# Patient Record
Sex: Female | Born: 1985 | Race: White | Hispanic: No | Marital: Single | Smoking: Current every day smoker
Health system: Southern US, Community
[De-identification: ages and names within clinical notes are randomized; demographics above are authoritative.]

## PROBLEM LIST (undated history)

## (undated) DIAGNOSIS — G43909 Migraine, unspecified, not intractable, without status migrainosus: Secondary | ICD-10-CM

## (undated) DIAGNOSIS — N200 Calculus of kidney: Secondary | ICD-10-CM

## (undated) DIAGNOSIS — F17200 Nicotine dependence, unspecified, uncomplicated: Secondary | ICD-10-CM

## (undated) HISTORY — DX: Nicotine dependence, unspecified, uncomplicated: F17.200

## (undated) HISTORY — PX: LITHOTRIPSY: SUR834

## (undated) HISTORY — PX: TONSILLECTOMY: SUR1361

---

## 2008-12-04 ENCOUNTER — Emergency Department: Payer: Self-pay | Admitting: Emergency Medicine

## 2008-12-15 ENCOUNTER — Ambulatory Visit: Payer: Self-pay | Admitting: Urology

## 2008-12-30 ENCOUNTER — Ambulatory Visit: Payer: Self-pay | Admitting: Urology

## 2009-01-02 ENCOUNTER — Ambulatory Visit: Payer: Self-pay | Admitting: Urology

## 2009-01-07 ENCOUNTER — Ambulatory Visit: Payer: Self-pay | Admitting: Urology

## 2009-01-09 ENCOUNTER — Emergency Department: Payer: Self-pay | Admitting: Emergency Medicine

## 2009-07-22 ENCOUNTER — Inpatient Hospital Stay: Payer: Self-pay | Admitting: Internal Medicine

## 2009-07-23 ENCOUNTER — Inpatient Hospital Stay: Payer: Self-pay | Admitting: Psychiatry

## 2009-12-31 IMAGING — CT CT STONE STUDY
1 of 2 series · 16 of 32 positions shown, 20 images · non-contrast
Comparison: none

REASON FOR EXAM: abdominal pain, recent lithotripsy
COMMENTS:   LMP: neg preg test

[Series 2: soft tissue · axial · 0.69mm/px · z∈[-88,+326]mm · 16 of 150 slices shown, 20 images]
[im 6/150  soft-tissue]
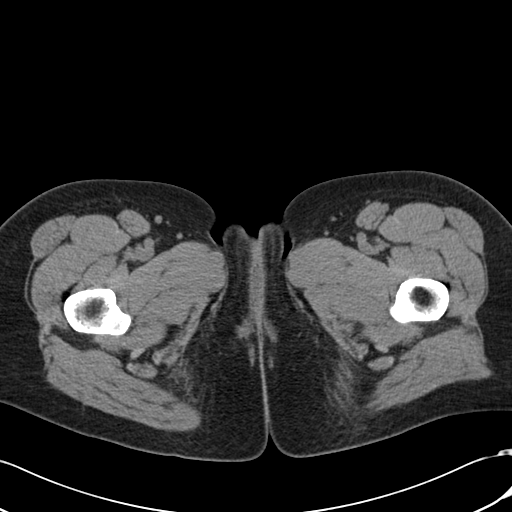
[im 6/150  bone]
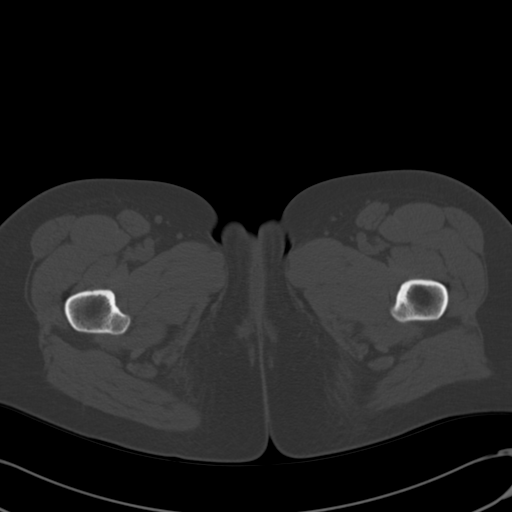
[im 18/150  soft-tissue]
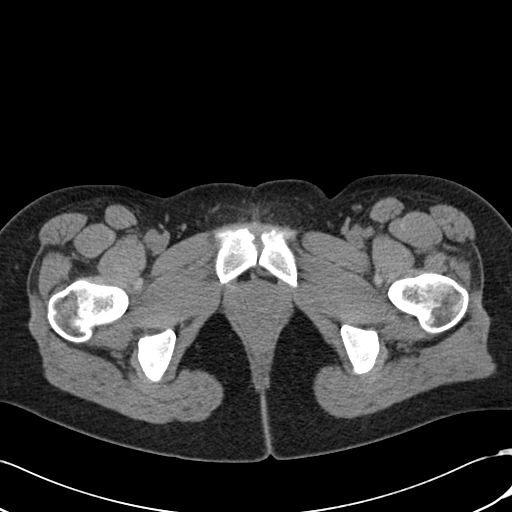
[im 30/150  soft-tissue]
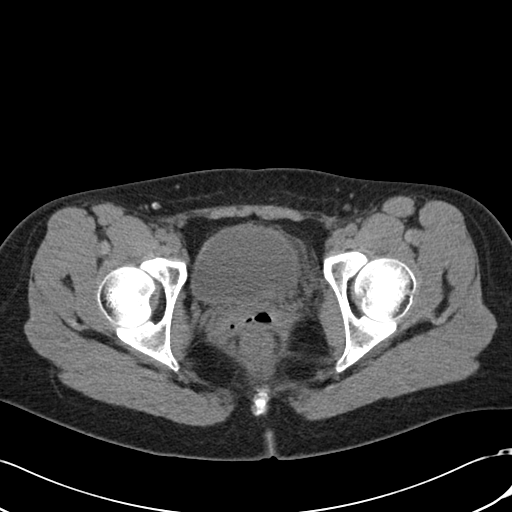
[im 42/150  soft-tissue]
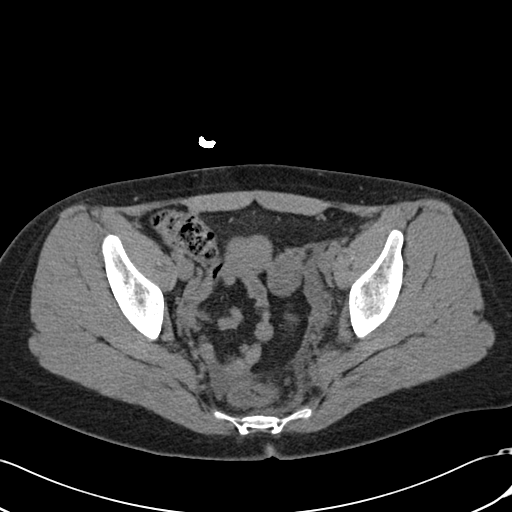
[im 48/150  soft-tissue]
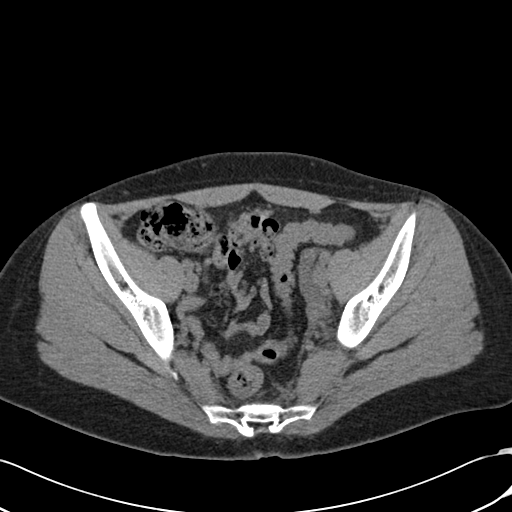
[im 60/150  soft-tissue]
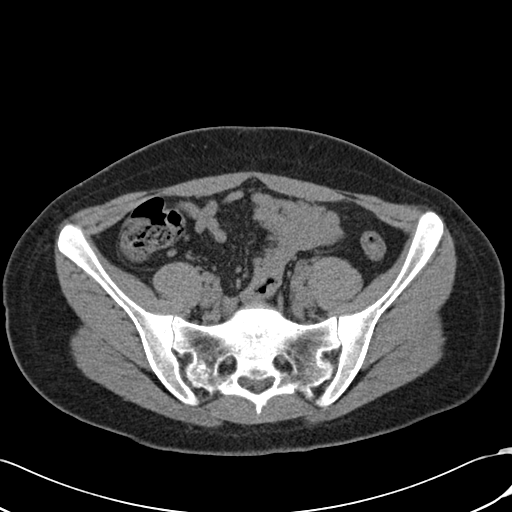
[im 72/150  soft-tissue]
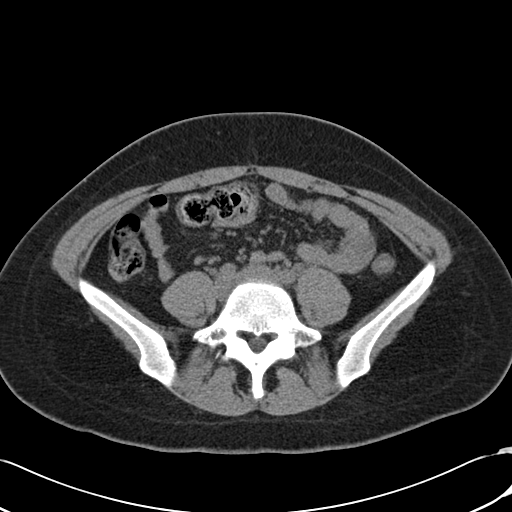
[im 78/150  soft-tissue]
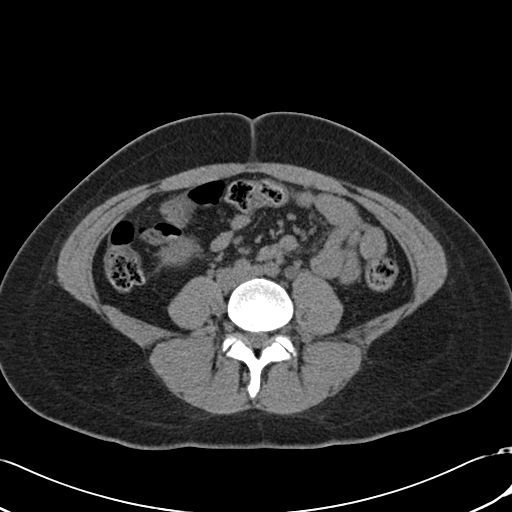
[im 90/150  soft-tissue]
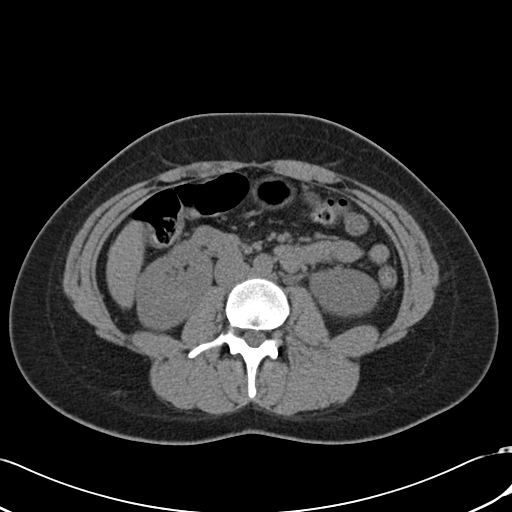
[im 90/150  bone]
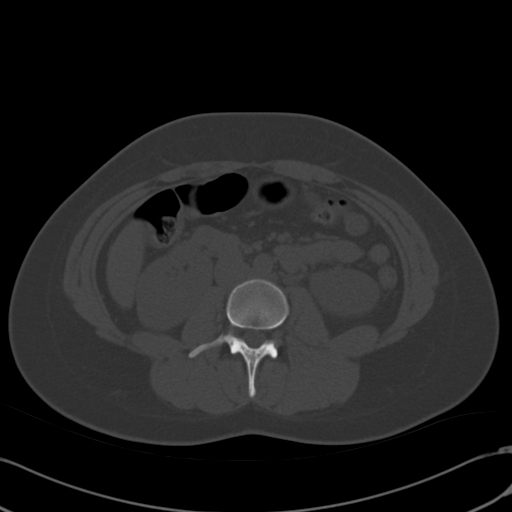
[im 102/150  soft-tissue]
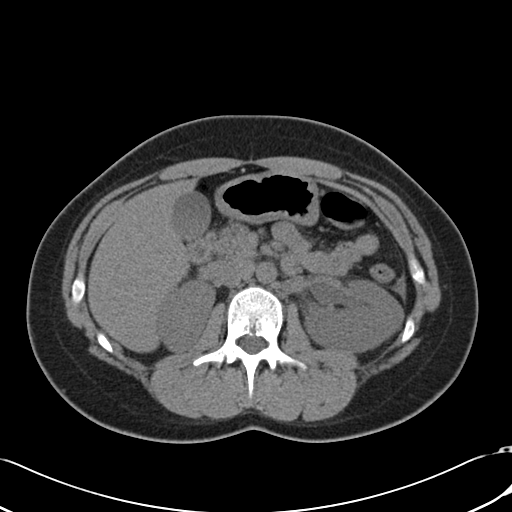
[im 114/150  soft-tissue]
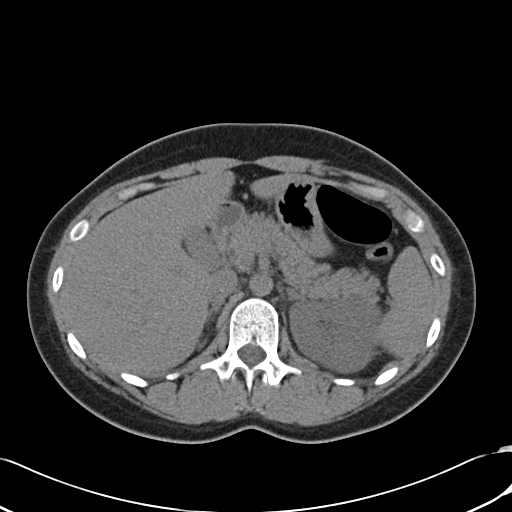
[im 120/150  soft-tissue]
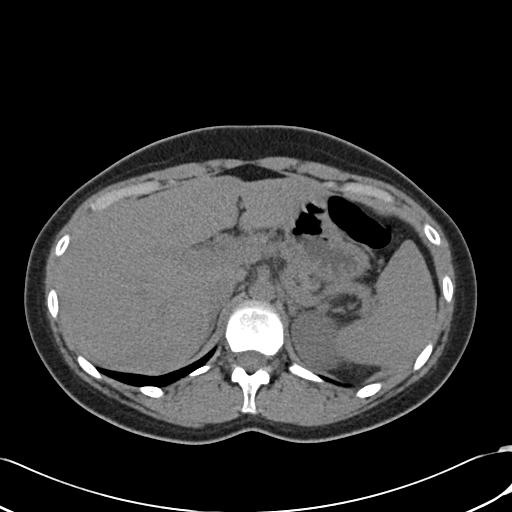
[im 126/150  lung]
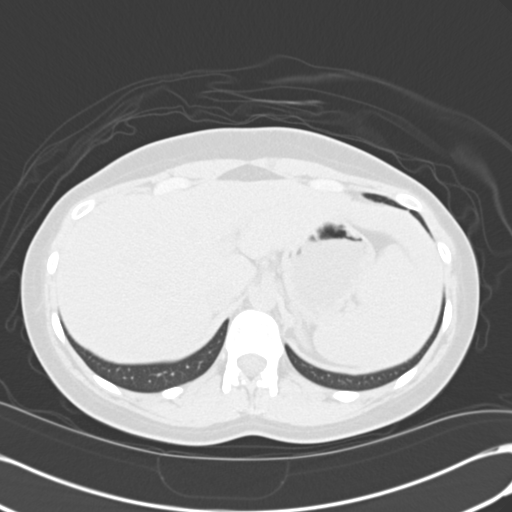
[im 132/150  soft-tissue]
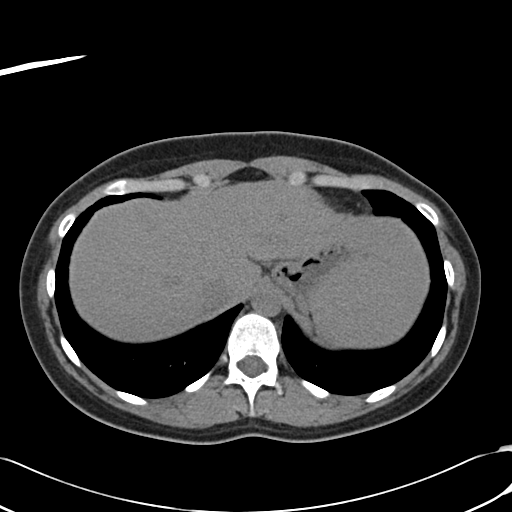
[im 132/150  lung]
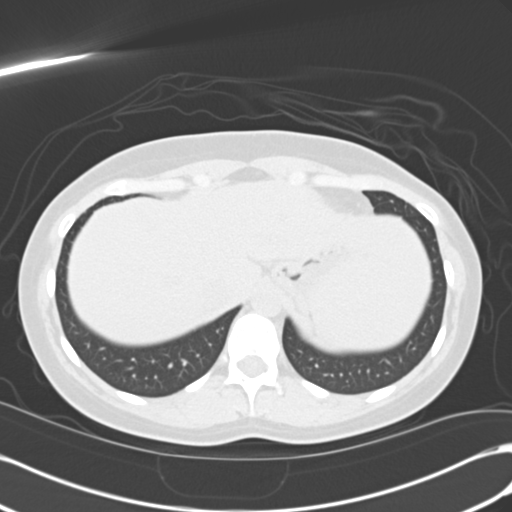
[im 138/150  lung]
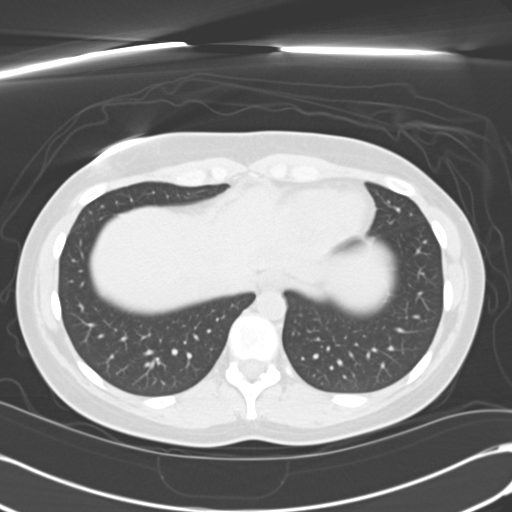
[im 144/150  soft-tissue]
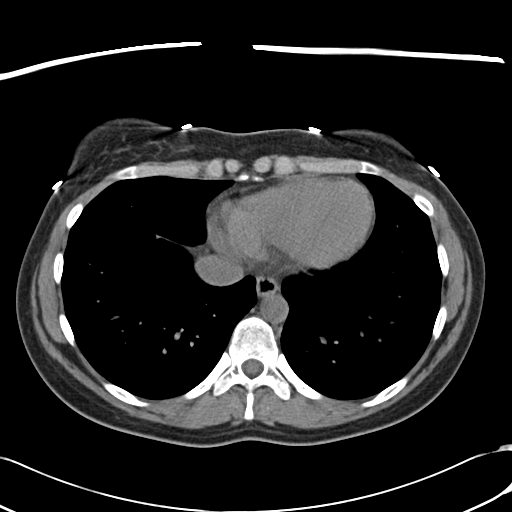
[im 144/150  lung]
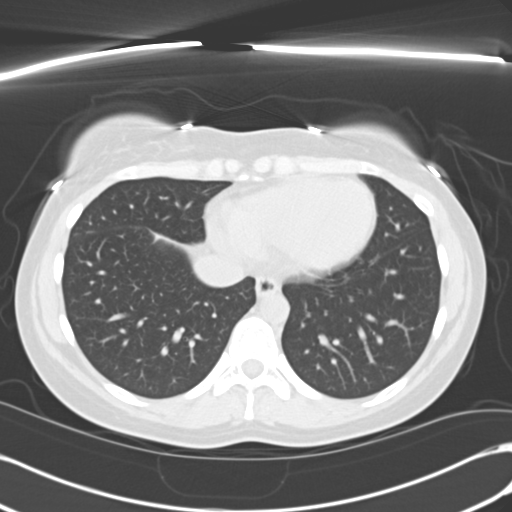

[16 of 32 positions shown; findings below may reference images not displayed]

PROCEDURE:     CT  - CT ABDOMEN /PELVIS WO (STONE)  - January 09, 2009  [DATE]

RESULT:     History: Pelvic pain

Comparison Study: Prior CT of the abdomen/ pelvis of 12/04/2008.

Procedure and findings: Standard nonenhanced CT the abdomen pelvis obtained.
Liver and spleen are normal. Pancreas normal. Right kidney is unremarkable.
Stone debris is noted in the distal left ureter. This is barely perceptible.
Present of a large distal left ureteral stone is no longer identified.
Bladder nondistended. Small amount of free fluid noted in the cul-de-sac.
Lung bases are clear.
IMPRESSION: Sand-like stone in the distal left ureter with mild left
hydronephrosis and hydroureter, previously identified large stone in the
distal left ureter is no longer present. Tiny small stone fragments may be.

## 2012-12-12 ENCOUNTER — Ambulatory Visit: Payer: Self-pay | Admitting: Family Medicine

## 2013-03-12 ENCOUNTER — Inpatient Hospital Stay: Payer: Self-pay

## 2013-03-12 LAB — CBC WITH DIFFERENTIAL/PLATELET
Basophil %: 0.2 %
HCT: 35.5 % (ref 35.0–47.0)
MCHC: 34.4 g/dL (ref 32.0–36.0)
MCV: 86 fL (ref 80–100)
Neutrophil %: 68.7 %
RBC: 4.15 10*6/uL (ref 3.80–5.20)
RDW: 13.2 % (ref 11.5–14.5)
WBC: 10.6 10*3/uL (ref 3.6–11.0)

## 2013-12-03 IMAGING — US US RENAL KIDNEY
1 series · 14 of 25 positions shown · non-contrast
Comparison: none

REASON FOR EXAM: Dr Dontsop Gustave Fax  [phone] Phone [phone]
[facility] [address]...
COMMENTS:

[Series 1: us renal kidney · 0.30mm/px · 14 of 44 slices shown]
[im 1/44]
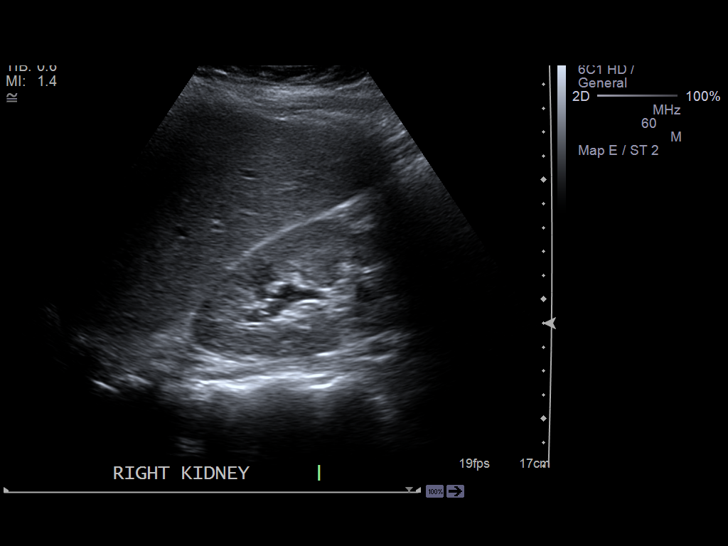
[im 4/44]
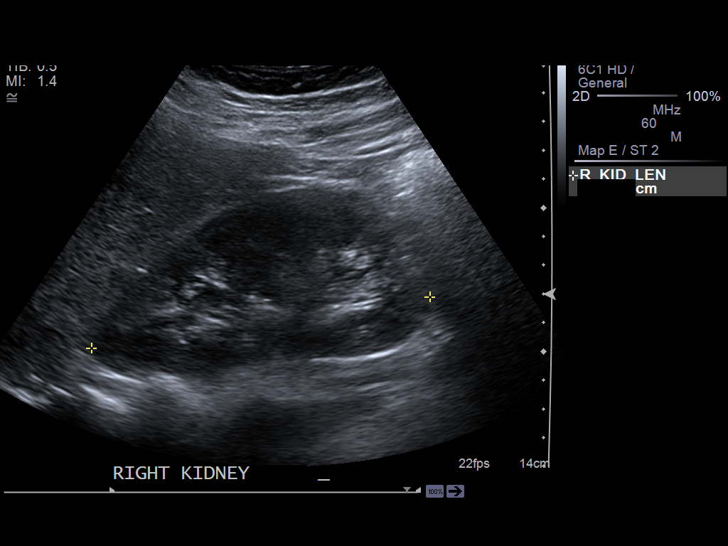
[im 8/44]
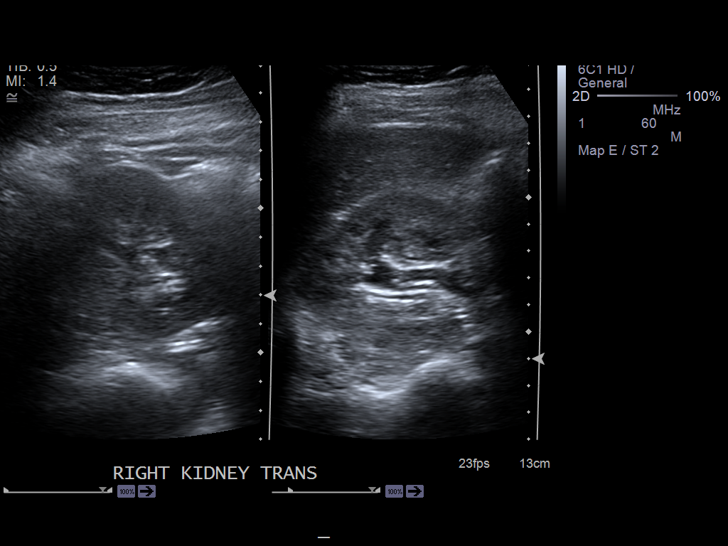
[im 11/44]
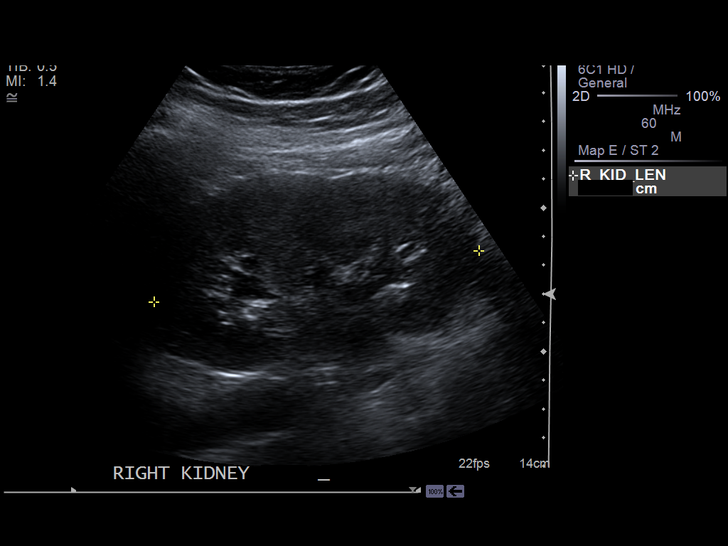
[im 15/44]
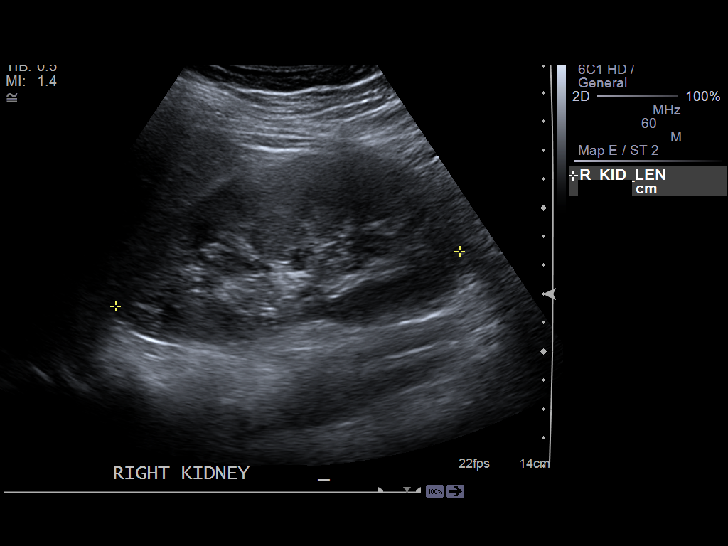
[im 17/44]
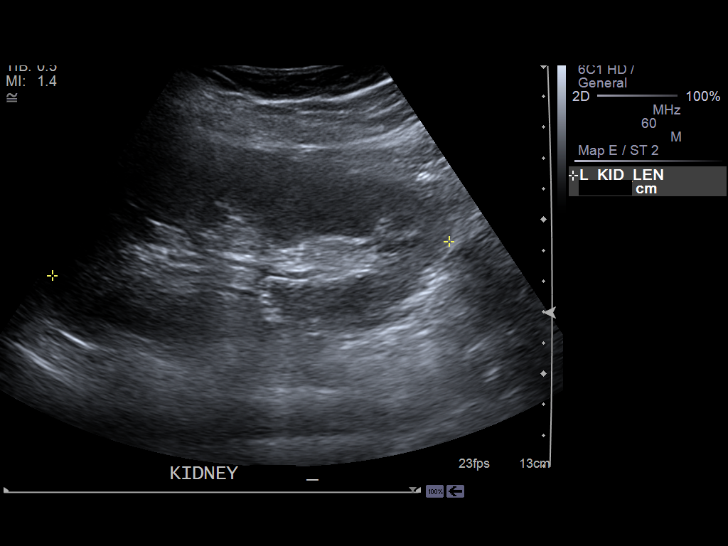
[im 20/44]
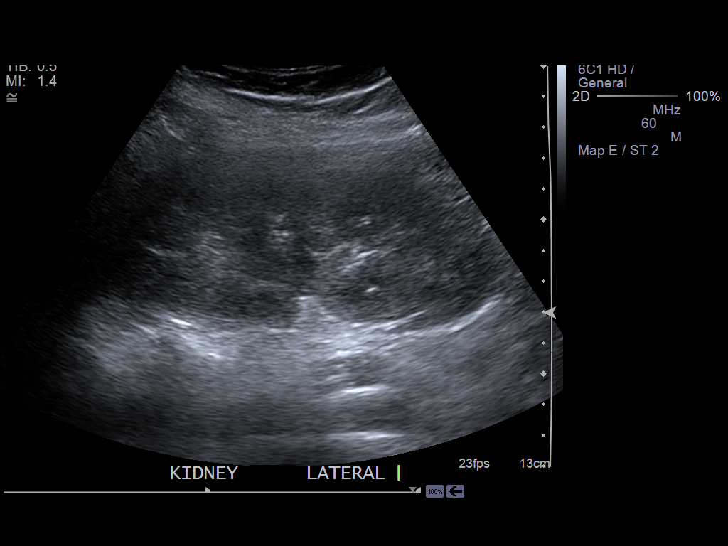
[im 24/44]
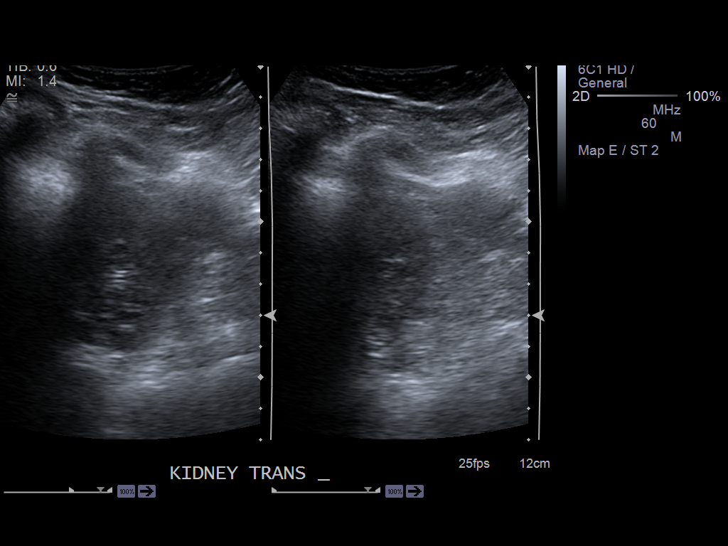
[im 27/44]
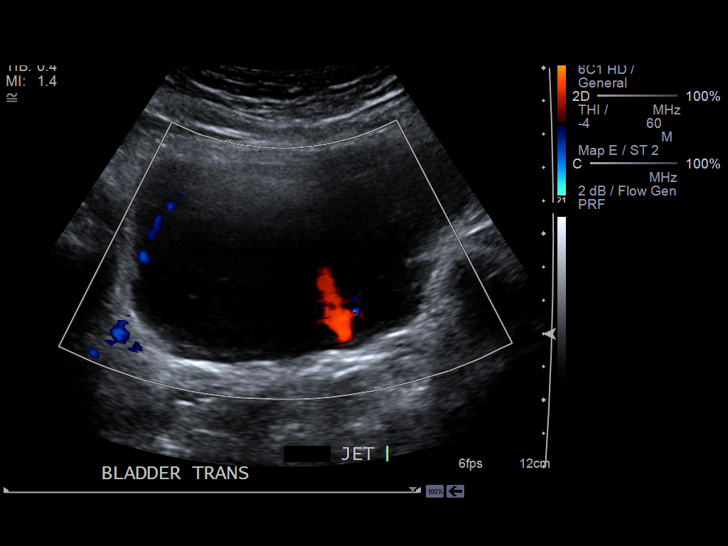
[im 29/44]
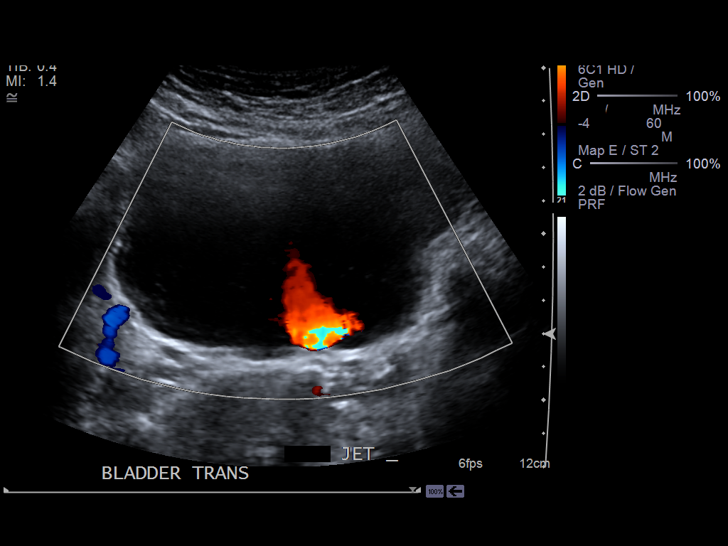
[im 33/44]
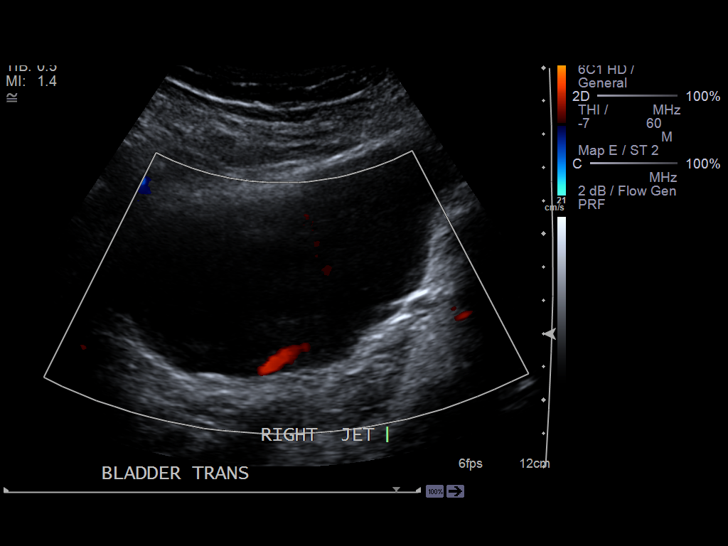
[im 36/44]
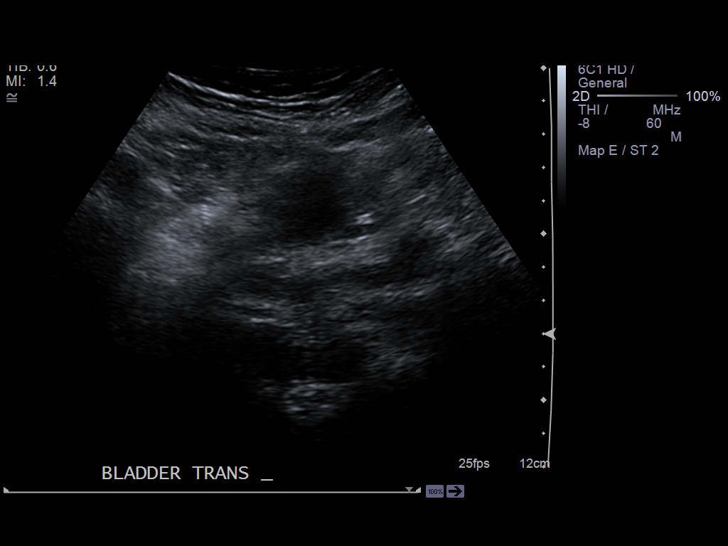
[im 40/44]
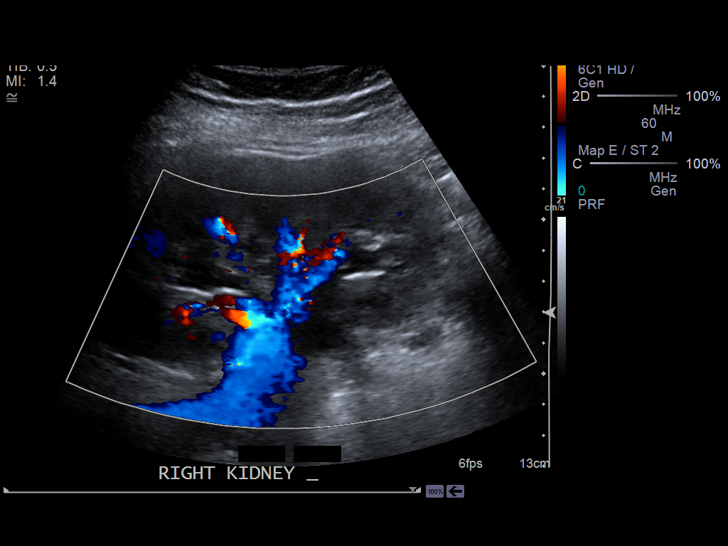
[im 44/44]
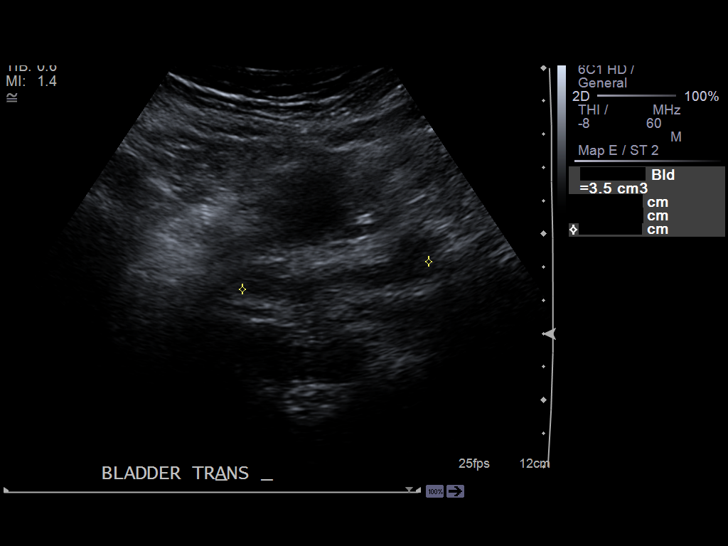

[14 of 25 positions shown; findings below may reference images not displayed]

PROCEDURE:     AMAZIGH - AMAZIGH KIDNEYS  - December 12, 2012  [DATE]

RESULT:     The right kidney measures 12.13 x 5.74 x 5.74 cm and shows
minimal right hydronephrosis. The left kidney measures 12.92 x 5.91 x
cm and shows no hydronephrosis. No stones or solid masses are seen. Ureteral
jets are demonstrated bilaterally at the bladder. There is a moderate amount
of urine in the bladder on the prevoid images. Bladder volume prior to
voiding is 124.2 cc. The post void bladder volume is 3.5 cc.
IMPRESSION: 1. Minimal right hydronephrosis which persisted on the post void images.
Otherwise, unremarkable study.

[REDACTED]

## 2014-11-04 NOTE — H&P (Signed)
L&D Evaluation:  History:  HPI 29 year old G1 P0 with EDC=03/17/2013 by LMP=06/10/2012 + US at 8 weeks presents at 4439 5/7 weeks with c/o SROM of clear fluid at 0300. Onset contractions at 0500. Baby active. Denies VB. [facility] for migraines (was taking Fioricet), a Staph UTI in March, and [facility] December 2012. Recieved TDAP this pregnancy. LABS: O negative, VI, RI, GBS negative.   Presents with contractions, leaking fluid   Patient's Medical History Migraines, urolithiasis   Patient's Surgical History Tonsilectomy, ureteroscopy and laser lithotripsy   Medications Pre Natal Vitamins   Allergies Sulfa   Social History tobacco  Quit smoking 06/2012   Family History Non-Contributory   ROS:  ROS see HPI   Exam:  Vital Signs stable  113/72   Urine Protein not completed   General no apparent distress   Mental Status clear   Chest clear   Heart normal sinus rhythm, no murmur/gallop/rubs   Abdomen gravid, tender with contractions   Estimated Fetal Weight Average for gestational age   Fetal Position cephalic   Edema no edema   Reflexes 2+   Pelvic no external lesions, 1/75%/-2 per RN exam   Mebranes Ruptured, positive Nitrazine   Description clear   FHT normal rate with no decels, 140s with accels to 170s   FHT Description mod variability. Cat 1   Ucx irregular   Skin dry   Impression:  Impression IUP at 39 2/7 weeks with SROM x 3 hours   Plan:  Plan EFM/NST, monitor contractions and for cervical change, expectant management for now. Consider Pitocin if no progress later this AM.  Can ambulate. Desires epidural when appropriate.   Electronic Signatures: Trinna BalloonGutierrez, Weslie Pretlow L (CNM)  (Signed 16-Sep-14 06:36)  Authored: L&D Evaluation   Last Updated: 16-Sep-14 06:36 by Trinna BalloonGutierrez, Torian Quintero L (CNM)

## 2015-05-15 ENCOUNTER — Encounter: Payer: Self-pay | Admitting: Internal Medicine

## 2015-05-15 ENCOUNTER — Other Ambulatory Visit: Payer: Self-pay | Admitting: Internal Medicine

## 2015-05-15 DIAGNOSIS — F324 Major depressive disorder, single episode, in partial remission: Secondary | ICD-10-CM | POA: Insufficient documentation

## 2015-11-26 ENCOUNTER — Encounter: Payer: Self-pay | Admitting: Internal Medicine

## 2015-11-26 ENCOUNTER — Ambulatory Visit (INDEPENDENT_AMBULATORY_CARE_PROVIDER_SITE_OTHER): Payer: Self-pay | Admitting: Internal Medicine

## 2015-11-26 VITALS — BP 108/78 | HR 74 | Temp 98.4°F | Resp 16 | Ht 67.0 in | Wt 145.0 lb

## 2015-11-26 DIAGNOSIS — Z716 Tobacco abuse counseling: Secondary | ICD-10-CM

## 2015-11-26 DIAGNOSIS — F324 Major depressive disorder, single episode, in partial remission: Secondary | ICD-10-CM

## 2015-11-26 DIAGNOSIS — J018 Other acute sinusitis: Secondary | ICD-10-CM

## 2015-11-26 DIAGNOSIS — Z720 Tobacco use: Secondary | ICD-10-CM

## 2015-11-26 DIAGNOSIS — F17200 Nicotine dependence, unspecified, uncomplicated: Secondary | ICD-10-CM

## 2015-11-26 MED ORDER — BUPROPION HCL ER (XL) 150 MG PO TB24: 150.0000 mg | ORAL_TABLET | Freq: Every day | ORAL | Status: DC

## 2015-11-26 MED ORDER — GUAIFENESIN-CODEINE 100-10 MG/5ML PO SYRP: 5.0000 mL | ORAL_SOLUTION | Freq: Three times a day (TID) | ORAL | Status: DC | PRN

## 2015-11-26 MED ORDER — AMOXICILLIN-POT CLAVULANATE 875-125 MG PO TABS: 1.0000 | ORAL_TABLET | Freq: Two times a day (BID) | ORAL | Status: DC

## 2015-11-26 NOTE — Progress Notes (Signed)
Date:  11/26/2015   Name:  Melinda Dixon   DOB:  [birth year]   MRN:  [id number]   Chief Complaint: Depression and Sinus Problem Depression        This is a chronic problem.  The current episode started more than 1 year ago.   The problem has been gradually worsening (started to worsen several months ago) since onset.  Associated symptoms include fatigue, insomnia, decreased interest and sad.  Associated symptoms include no suicidal ideas.  Past treatments include SSRIs - Selective serotonin reuptake inhibitors.  Compliance with treatment is good. Sinus Problem This is a new problem. The current episode started 1 to 4 weeks ago. The maximum temperature recorded prior to her arrival was 100.4 - 100.9 F. Associated symptoms include congestion and sinus pressure. Pertinent negatives include no chills, coughing, diaphoresis or shortness of breath.      Review of Systems  Constitutional: Positive for fever and fatigue. Negative for chills and diaphoresis.  HENT: Positive for congestion and sinus pressure. Negative for tinnitus, trouble swallowing and voice change.   Respiratory: Negative for cough, chest tightness and shortness of breath.   Cardiovascular: Negative for chest pain and palpitations.  Psychiatric/Behavioral: Positive for depression and dysphoric mood. Negative for suicidal ideas. The patient is nervous/anxious and has insomnia.     Patient Active Problem List   Diagnosis Date Noted  . Nicotine addiction   . Major depressive disorder with single episode, in partial remission (HCC) 05/15/2015    Prior to Admission medications   Medication Sig Start Date End Date Taking? Authorizing Provider  citalopram (CELEXA) 20 MG tablet Take 1 tablet by mouth daily. 11/05/14  Yes Historical Provider, MD  levonorgestrel (MIRENA, 52 MG,) 20 MCG/24HR IUD by Intrauterine route.   Yes Historical Provider, MD    Allergies  Allergen Reactions  . Sulfa Antibiotics Rash    Past Surgical  History  Procedure Laterality Date  . Tonsillectomy    . Lithotripsy      Social History  Substance Use Topics  . Smoking status: Current Every Day Smoker  . Smokeless tobacco: None  . Alcohol Use: 1.2 oz/week    2 Standard drinks or equivalent per week     Medication list has been reviewed and updated.   Physical Exam  Constitutional: She is oriented to person, place, and time. She appears well-developed and well-nourished.  HENT:  Right Ear: External ear and ear canal normal. Tympanic membrane is not erythematous and not retracted.  Left Ear: External ear and ear canal normal. Tympanic membrane is not erythematous and not retracted.  Nose: Right sinus exhibits maxillary sinus tenderness and frontal sinus tenderness. Left sinus exhibits maxillary sinus tenderness and frontal sinus tenderness.  Mouth/Throat: Uvula is midline and mucous membranes are normal. No oral lesions. Posterior oropharyngeal erythema present. No oropharyngeal exudate.  Cardiovascular: Normal rate, regular rhythm and normal heart sounds.   Pulmonary/Chest: Breath sounds normal. She has no wheezes. She has no rales.  Lymphadenopathy:    She has no cervical adenopathy.  Neurological: She is alert and oriented to person, place, and time.  Psychiatric: Her speech is normal and behavior is normal. Her mood appears anxious. Cognition and memory are normal.    BP 108/78 mmHg  Pulse 74  Temp(Src) 98.4 F (36.9 C)  Resp 16  Ht 5\' 7"  (1.702 m)  Wt 145 lb (65.772 kg)  BMI 22.71 kg/m2  SpO2 99%  Assessment and Plan: 1. Major depressive disorder with single  episode, in partial remission (HCC) Continue celexa but add Wellbutrin - buPROPion (WELLBUTRIN XL) 150 MG 24 hr tablet; Take 1 tablet (150 mg total) by mouth daily.  Dispense: 30 tablet; Refill: 1  2. Smoker - Nurse to provide smoking / tobacco cessation education  3. Tobacco abuse counseling  4. Other acute sinusitis Continue sudafed and nasal  spray - amoxicillin-clavulanate (AUGMENTIN) 875-125 MG tablet; Take 1 tablet by mouth 2 (two) times daily.  Dispense: 20 tablet; Refill: 0 - guaiFENesin-codeine (ROBITUSSIN AC) 100 10 MG/5ML syrup; Take 5 mLs by mouth 3 (three) times daily as needed for cough.  Dispense: 150 mL; Refill: 0  Bari Edward, MD [facility]  11/26/2015

## 2015-12-24 ENCOUNTER — Ambulatory Visit: Payer: Self-pay | Admitting: Internal Medicine

## 2015-12-25 ENCOUNTER — Ambulatory Visit: Payer: Self-pay | Admitting: Internal Medicine

## 2015-12-30 ENCOUNTER — Ambulatory Visit: Payer: Self-pay | Admitting: Internal Medicine

## 2016-01-25 ENCOUNTER — Emergency Department
Admission: EM | Admit: 2016-01-25 | Discharge: 2016-01-25 | Disposition: A | Discharge status: Home / Self Care | Payer: Self-pay | Attending: Emergency Medicine | Admitting: Emergency Medicine

## 2016-01-25 ENCOUNTER — Encounter: Payer: Self-pay | Admitting: Emergency Medicine

## 2016-01-25 DIAGNOSIS — Y999 Unspecified external cause status: Secondary | ICD-10-CM | POA: Insufficient documentation

## 2016-01-25 DIAGNOSIS — R21 Rash and other nonspecific skin eruption: Secondary | ICD-10-CM

## 2016-01-25 DIAGNOSIS — Y929 Unspecified place or not applicable: Secondary | ICD-10-CM | POA: Insufficient documentation

## 2016-01-25 DIAGNOSIS — Y939 Activity, unspecified: Secondary | ICD-10-CM | POA: Insufficient documentation

## 2016-01-25 DIAGNOSIS — G8929 Other chronic pain: Secondary | ICD-10-CM | POA: Insufficient documentation

## 2016-01-25 DIAGNOSIS — W57XXXA Bitten or stung by nonvenomous insect and other nonvenomous arthropods, initial encounter: Secondary | ICD-10-CM | POA: Insufficient documentation

## 2016-01-25 DIAGNOSIS — F17200 Nicotine dependence, unspecified, uncomplicated: Secondary | ICD-10-CM | POA: Insufficient documentation

## 2016-01-25 DIAGNOSIS — M549 Dorsalgia, unspecified: Secondary | ICD-10-CM

## 2016-01-25 DIAGNOSIS — S30860A Insect bite (nonvenomous) of lower back and pelvis, initial encounter: Secondary | ICD-10-CM | POA: Insufficient documentation

## 2016-01-25 DIAGNOSIS — M545 Low back pain: Secondary | ICD-10-CM

## 2016-01-25 HISTORY — DX: Migraine, unspecified, not intractable, without status migrainosus: G43.909

## 2016-01-25 HISTORY — DX: Calculus of kidney: N20.0

## 2016-01-25 LAB — COMPREHENSIVE METABOLIC PANEL
ALBUMIN: 4.7 g/dL (ref 3.5–5.0)
ALT: 30 U/L (ref 14–54)
AST: 30 U/L (ref 15–41)
Alkaline Phosphatase: 68 U/L (ref 38–126)
Anion gap: 9 (ref 5–15)
BUN: 15 mg/dL (ref 6–20)
CHLORIDE: 104 mmol/L (ref 101–111)
CO2: 26 mmol/L (ref 22–32)
CREATININE: 0.83 mg/dL (ref 0.44–1.00)
Calcium: 9.3 mg/dL (ref 8.9–10.3)
GFR calc Af Amer: >60 mL/min (ref >60)
GFR calc non Af Amer: >60 mL/min (ref >60)
GLUCOSE: 92 mg/dL (ref 65–99)
POTASSIUM: 3.9 mmol/L (ref 3.5–5.1)
Sodium: 139 mmol/L (ref 135–145)
Total Bilirubin: 0.5 mg/dL (ref 0.3–1.2)
Total Protein: 7.7 g/dL (ref 6.5–8.1)

## 2016-01-25 LAB — CBC WITH DIFFERENTIAL/PLATELET
BASOS ABS: 0 10*3/uL (ref 0–0.1)
BASOS PCT: 1 %
EOS PCT: 1 %
Eosinophils Absolute: 0.1 10*3/uL (ref 0–0.7)
HCT: 40.1 % (ref 35.0–47.0)
Hemoglobin: 13.7 g/dL (ref 12.0–16.0)
LYMPHS PCT: 33 %
Lymphs Abs: 1.5 10*3/uL (ref 1.0–3.6)
MCH: 28.9 pg (ref 26.0–34.0)
MCHC: 34.2 g/dL (ref 32.0–36.0)
MCV: 84.7 fL (ref 80.0–100.0)
MONO ABS: 0.4 10*3/uL (ref 0.2–0.9)
Monocytes Relative: 8 %
NEUTROS ABS: 2.7 10*3/uL (ref 1.4–6.5)
Neutrophils Relative %: 57 %
PLATELETS: 207 10*3/uL (ref 150–440)
RBC: 4.74 MIL/uL (ref 3.80–5.20)
RDW: 13.4 % (ref 11.5–14.5)
WBC: 4.6 10*3/uL (ref 3.6–11.0)

## 2016-01-25 LAB — SEDIMENTATION RATE: Sed Rate: 6 mm/hr (ref 0–20)

## 2016-01-25 MED ORDER — TRIAMCINOLONE ACETONIDE 0.5 % EX OINT: 1.0000 "application " | TOPICAL_OINTMENT | Freq: Two times a day (BID) | CUTANEOUS | 0 refills | Status: DC

## 2016-01-25 MED ORDER — DOXYCYCLINE HYCLATE 100 MG PO CAPS: 100.0000 mg | ORAL_CAPSULE | Freq: Two times a day (BID) | ORAL | 0 refills | Status: DC

## 2016-01-25 MED ORDER — MELOXICAM 15 MG PO TABS: 15.0000 mg | ORAL_TABLET | Freq: Every day | ORAL | 0 refills | Status: DC

## 2016-01-25 NOTE — ED Triage Notes (Signed)
Pt reports rash to bilateral arms and legs since Saturday.

## 2016-01-25 NOTE — ED Provider Notes (Signed)
[facility] Emergency Department Provider Note ____________________________________________  Time seen: Approximately 11:04 AM  I have reviewed the triage vital signs and the nursing notes.   HISTORY  Chief Complaint No chief complaint on file.    HPI Melinda Dixon is a 30 y.o. female who presents to the emergency department for evaluation of back pain and a rash that developed 2 days ago. She states that the rash started on her inner forearms and has now spread to her lower extremities as well. She has had multiple tick bites over the past couple of weeks. She also complains of a headache and feeling tired. She denies any known fevers, but she has been taking Tylenol or ibuprofen for her headache and back pain. She has never had a rash similar to this in the past. Her back pain started 3 years ago after her epidural during childbirth.  Past Medical History:  Diagnosis Date  . Nicotine addiction    Gave [facility] info     Patient Active Problem List   Diagnosis Date Noted  . Nicotine addiction   . Major depressive disorder with single episode, in partial remission (HCC) 05/15/2015    Past Surgical History:  Procedure Laterality Date  . LITHOTRIPSY    . TONSILLECTOMY      Prior to Admission medications   Medication Sig Start Date End Date Taking? Authorizing Provider  amoxicillin-clavulanate (AUGMENTIN) 875-125 MG tablet Take 1 tablet by mouth 2 (two) times daily. 11/26/15   Reubin Milan, MD  buPROPion (WELLBUTRIN XL) 150 MG 24 hr tablet Take 1 tablet (150 mg total) by mouth daily. 11/26/15   Reubin Milan, MD  citalopram (CELEXA) 20 MG tablet Take 1 tablet by mouth daily. 11/05/14   Historical Provider, MD  guaiFENesin-codeine (ROBITUSSIN AC) 100-10 MG/5ML syrup Take 5 mLs by mouth 3 (three) times daily as needed for cough. 11/26/15   Reubin Milan, MD  levonorgestrel (MIRENA, 52 MG,) 20 MCG/24HR IUD by Intrauterine route.    Historical Provider, MD     Allergies Sulfa antibiotics  Family History  Problem Relation Age of Onset  . Diabetes Mother   . CAD Mother     Social History Social History  Substance Use Topics  . Smoking status: Current Every Day Smoker  . Smokeless tobacco: Not on file  . Alcohol use 1.2 oz/week    2 Standard drinks or equivalent per week    Review of Systems Constitutional: No recent illness. Cardiovascular: Denies chest pain or palpitations. Respiratory: Denies shortness of breath. Musculoskeletal: Pain in Lower back Skin: Positive for rash Neurological: Negative for focal weakness or numbness.  ____________________________________________   PHYSICAL EXAM:  VITAL SIGNS: ED Triage Vitals [01/25/16 1049]  Enc Vitals Group     BP 117/69     Pulse Rate 81     Resp 18     Temp 98.2 F (36.8 C)     Temp Source Oral     SpO2 99 %     Weight 140 lb (63.5 kg)     Height 5\' 8"  (1.727 m)     Head Circumference      Peak Flow      Pain Score      Pain Loc      Pain Edu?      Excl. in GC?     Constitutional: Alert and oriented. Well appearing and in no acute distress. Eyes: Conjunctivae are normal. EOMI. Head: Atraumatic. Neck: No stridor.  Respiratory:  Normal respiratory effort.   Musculoskeletal: Full range of motion throughout. Paraspinal tenderness in the lower back with bending forward. Neurologic:  Normal speech and language. No gross focal neurologic deficits are appreciated. Speech is normal. No gait instability. Skin:  Erythematous, maculopapular rash scattered over her forearms, forehead, and bilateral lower extremities. Psychiatric: Mood and affect are normal. Speech and behavior are normal.  ____________________________________________   LABS (all labs ordered are listed, but only abnormal results are displayed)  Labs Reviewed - No data to display ____________________________________________  RADIOLOGY  Not  indicated ____________________________________________   PROCEDURES  Procedure(s) performed: None   ____________________________________________   INITIAL IMPRESSION / ASSESSMENT AND PLAN / ED COURSE  Pertinent labs & imaging results that were available during my care of the patient were reviewed by me and considered in my medical decision making (see chart for details).  Patient will be placed on doxycycline empirically until testing for [facility] spotted fever and Lyme's disease is back. She was advised to stay out of the sun as much as possible. She was advised to take meloxicam for her back pain and follow-up with her primary care provider for further evaluation. She was advised to return to the emergency department for symptoms that change or worsen if she is unable to schedule an appointment. ____________________________________________   FINAL CLINICAL IMPRESSION(S) / ED DIAGNOSES  Final diagnoses:  None       Chinita Pester, FNP 01/25/16 1308    Minna Antis, MD 01/25/16 [phone]

## 2016-01-26 LAB — B. BURGDORFI ANTIBODIES: B burgdorferi Ab IgG+IgM: <0.91 {ISR} (ref 0.00–0.90)

## 2016-01-30 LAB — ROCKY MTN SPOTTED FVR ABS PNL(IGG+IGM)
RMSF IgG: NEGATIVE
RMSF IgM: 0.94 index — ABNORMAL HIGH (ref 0.00–0.89)

## 2016-03-03 ENCOUNTER — Other Ambulatory Visit: Payer: Self-pay | Admitting: Internal Medicine

## 2016-03-03 DIAGNOSIS — F324 Major depressive disorder, single episode, in partial remission: Secondary | ICD-10-CM

## 2016-03-23 NOTE — Telephone Encounter (Signed)
Patient does not have a working number [employer] - contacted pharmacy and they have the same number on file for this patient.

## 2019-03-11 ENCOUNTER — Other Ambulatory Visit (HOSPITAL_COMMUNITY)
Admission: RE | Admit: 2019-03-11 | Discharge: 2019-03-11 | Disposition: A | Discharge status: Home / Self Care | Payer: Medicaid Other | Source: Ambulatory Visit | Attending: Obstetrics and Gynecology | Admitting: Obstetrics and Gynecology

## 2019-03-11 ENCOUNTER — Ambulatory Visit (INDEPENDENT_AMBULATORY_CARE_PROVIDER_SITE_OTHER): Payer: Medicaid Other | Admitting: Obstetrics and Gynecology

## 2019-03-11 ENCOUNTER — Encounter: Payer: Self-pay | Admitting: Obstetrics and Gynecology

## 2019-03-11 ENCOUNTER — Other Ambulatory Visit: Payer: Self-pay

## 2019-03-11 VITALS — BP 128/84 | HR 97 | Ht 67.0 in | Wt 159.0 lb

## 2019-03-11 DIAGNOSIS — F5222 Female sexual arousal disorder: Secondary | ICD-10-CM

## 2019-03-11 DIAGNOSIS — Z30432 Encounter for removal of intrauterine contraceptive device: Secondary | ICD-10-CM

## 2019-03-11 DIAGNOSIS — Z0000 Encounter for general adult medical examination without abnormal findings: Secondary | ICD-10-CM

## 2019-03-11 DIAGNOSIS — Z01419 Encounter for gynecological examination (general) (routine) without abnormal findings: Secondary | ICD-10-CM

## 2019-03-11 DIAGNOSIS — Z1329 Encounter for screening for other suspected endocrine disorder: Secondary | ICD-10-CM

## 2019-03-11 DIAGNOSIS — Z124 Encounter for screening for malignant neoplasm of cervix: Secondary | ICD-10-CM | POA: Diagnosis present

## 2019-03-11 DIAGNOSIS — Z1239 Encounter for other screening for malignant neoplasm of breast: Secondary | ICD-10-CM

## 2019-03-11 MED ORDER — ADDYI 100 MG PO TABS: 100.0000 mg | ORAL_TABLET | Freq: Every day | ORAL | 3 refills | Status: AC

## 2019-03-11 NOTE — Progress Notes (Signed)
Gynecology Annual Exam   PCP: Reubin MilanBerglund, Laura H, MD  Chief Complaint:  Chief Complaint  Patient presents with  . Annual Exam    IUD removal, discuss other options    History of Present Illness: Patient is a 33 y.o. G2P1011 presents for annual exam. The patient has no complaints today.   LMP: No LMP recorded. (Menstrual status: IUD). Absent on Mirena IUD  The patient is sexually active. She currently uses IUD for contraception. She denies dyspareunia.   There is no notable family history of breast or ovarian cancer in her family.  The patient wears seatbelts: yes.   The patient has regular exercise: not asked.    The patient denies current symptoms of depression.    Review of Systems: Review of Systems  Constitutional: Negative for chills and fever.  HENT: Negative for congestion.   Respiratory: Negative for cough and shortness of breath.   Cardiovascular: Negative for chest pain and palpitations.  Gastrointestinal: Negative for abdominal pain, constipation, diarrhea, heartburn, nausea and vomiting.  Genitourinary: Negative for dysuria, frequency and urgency.  Skin: Negative for itching and rash.  Neurological: Negative for dizziness and headaches.  Endo/Heme/Allergies: Negative for polydipsia.  Psychiatric/Behavioral: Negative for depression.    Past Medical History:  Past Medical History:  Diagnosis Date  . Kidney stones   . Migraines   . Nicotine addiction    Gave [facility] info     Past Surgical History:  Past Surgical History:  Procedure Laterality Date  . LITHOTRIPSY    . TONSILLECTOMY      Gynecologic History:  No LMP recorded. (Menstrual status: IUD). Contraception: IUD  Obstetric History: G2P1011  Family History:  Family History  Problem Relation Age of Onset  . Diabetes Mother   . CAD Mother   . Throat cancer Mother [id number]    Social History:  Social History   Socioeconomic History  . Marital status: Single    Spouse name: Not on file  .  Number of children: Not on file  . Years of education: Not on file  . Highest education level: Not on file  Occupational History  . Not on file  Social Needs  . Financial resource strain: Not on file  . Food insecurity    Worry: Not on file    Inability: Not on file  . Transportation needs    Medical: Not on file    Non-medical: Not on file  Tobacco Use  . Smoking status: Current Every Day Smoker  . Smokeless tobacco: Never Used  Substance and Sexual Activity  . Alcohol use: Yes    Alcohol/week: 2.0 standard drinks    Types: 2 Standard drinks or equivalent per week  . Drug use: Never  . Sexual activity: Yes    Birth control/protection: None  Lifestyle  . Physical activity    Days per week: Not on file    Minutes per session: Not on file  . Stress: Not on file  Relationships  . Social Musicianconnections    Talks on phone: Not on file    Gets together: Not on file    Attends religious service: Not on file    Active member of club or organization: Not on file    Attends meetings of clubs or organizations: Not on file    Relationship status: Not on file  . Intimate partner violence    Fear of current or ex partner: Not on file    Emotionally abused: Not on file  Physically abused: Not on file    Forced sexual activity: Not on file  Other Topics Concern  . Not on file  Social History Narrative  . Not on file    Allergies:  Allergies  Allergen Reactions  . Sulfa Antibiotics Rash    Medications: Prior to Admission medications   Medication Sig Start Date End Date Taking? Authorizing Provider  levonorgestrel (MIRENA, 52 MG,) 20 MCG/24HR IUD by Intrauterine route.   Yes [provider]  Flibanserin (ADDYI) 100 MG TABS Take 100 mg by mouth daily. 03/11/19   Malachy Mood, MD    Physical Exam Vitals: Blood pressure 128/84, pulse 97, height 5\' 7"  (1.702 m), weight 159 lb (72.1 kg).  General: NAD HEENT: normocephalic, anicteric Thyroid: no enlargement, no  palpable nodules Pulmonary: No increased work of breathing, CTAB Cardiovascular: RRR, distal pulses 2+ Breast: Breast symmetrical, no tenderness, no palpable nodules or masses, no skin or nipple retraction present, no nipple discharge.  No axillary or supraclavicular lymphadenopathy. Abdomen: NABS, soft, non-tender, non-distended.  Umbilicus without lesions.  No hepatomegaly, splenomegaly or masses palpable. No evidence of hernia  Genitourinary:  External: Normal external female genitalia.  Normal urethral meatus, normal Bartholin's and Skene's glands.    Vagina: Normal vaginal mucosa, no evidence of prolapse.    Cervix: Grossly normal in appearance, no bleeding  Uterus: Non-enlarged, mobile, normal contour.  No CMT  Adnexa: ovaries non-enlarged, no adnexal masses  Rectal: deferred  Lymphatic: no evidence of inguinal lymphadenopathy Extremities: no edema, erythema, or tenderness Neurologic: Grossly intact Psychiatric: mood appropriate, affect full  Female chaperone present for pelvic and breast  portions of the physical exam    Assessment: 33 y.o. 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  Patient opts for every 3 years screening interval  3) Contraception - the patient is currently using  IUD.  She is potentially interested in changing her mode of contraception.  Her main reason for chaning are a) she is 6 year out from initial  Mirena placement b) she has noted low libido.  We discussed that changing contraceptive choice is unlikely to improve libido.  We discussed Mirena replacement, depo provera, contraceptive patch, nuvaring, OCP.  She is undecided on future pregnancies so would recommend against permanent  surgical options.   - Will keep IUD in until decision made on what alternative to switch to  4) Routine healthcare maintenance including cholesterol, diabetes screening discussed managed by PCP  5)Hyposexual arousal disorder  - TSH checked toay - Trial of Addyi - handouts on Addy given.  Vyleesi discussed as alternative  6) Return in about 1 month (around 04/10/2019) for medication follow up possible Mirena removal/replacement.   Malachy Mood, MD, [facility], [place]

## 2019-03-12 LAB — TSH: TSH: 1.45 u[IU]/mL (ref 0.450–4.500)

## 2019-03-14 LAB — CYTOLOGY - PAP
Diagnosis: NEGATIVE
HPV: NOT DETECTED

## 2019-04-08 ENCOUNTER — Ambulatory Visit: Payer: Medicaid Other | Admitting: Obstetrics and Gynecology
